# Patient Record
Sex: Female | Born: 1965 | Race: White | Hispanic: No | Marital: Married | Smoking: Never smoker
Health system: Southern US, Community
[De-identification: ages and names within clinical notes are randomized; demographics above are authoritative.]

## PROBLEM LIST (undated history)

## (undated) DIAGNOSIS — E049 Nontoxic goiter, unspecified: Secondary | ICD-10-CM

## (undated) HISTORY — DX: Nontoxic goiter, unspecified: E04.9

---
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
## 2000-01-03 ENCOUNTER — Other Ambulatory Visit: Admission: RE | Admit: 2000-01-03 | Discharge: 2000-01-03 | Payer: Self-pay | Admitting: Obstetrics and Gynecology

## 2000-05-13 ENCOUNTER — Encounter: Admission: RE | Admit: 2000-05-13 | Discharge: 2000-08-11 | Payer: Self-pay | Admitting: Obstetrics and Gynecology

## 2000-07-16 ENCOUNTER — Inpatient Hospital Stay (HOSPITAL_COMMUNITY): Admission: AD | Admit: 2000-07-16 | Discharge: 2000-07-16 | Payer: Self-pay | Admitting: Obstetrics and Gynecology

## 2000-07-18 ENCOUNTER — Inpatient Hospital Stay (HOSPITAL_COMMUNITY): Admission: AD | Admit: 2000-07-18 | Discharge: 2000-07-21 | Payer: Self-pay | Admitting: Obstetrics & Gynecology

## 2000-07-23 ENCOUNTER — Encounter: Admission: RE | Admit: 2000-07-23 | Discharge: 2000-08-22 | Payer: Self-pay | Admitting: Obstetrics and Gynecology

## 2000-08-28 ENCOUNTER — Other Ambulatory Visit: Admission: RE | Admit: 2000-08-28 | Discharge: 2000-08-28 | Payer: Self-pay | Admitting: Obstetrics and Gynecology

## 2002-04-14 ENCOUNTER — Other Ambulatory Visit: Admission: RE | Admit: 2002-04-14 | Discharge: 2002-04-14 | Payer: Self-pay | Admitting: Obstetrics and Gynecology

## 2003-06-07 ENCOUNTER — Other Ambulatory Visit: Admission: RE | Admit: 2003-06-07 | Discharge: 2003-06-07 | Payer: Self-pay | Admitting: Obstetrics and Gynecology

## 2004-04-15 DIAGNOSIS — E049 Nontoxic goiter, unspecified: Secondary | ICD-10-CM

## 2004-04-15 HISTORY — DX: Nontoxic goiter, unspecified: E04.9

## 2006-02-03 ENCOUNTER — Encounter: Admission: RE | Admit: 2006-02-03 | Discharge: 2006-02-03 | Payer: Self-pay | Admitting: Endocrinology

## 2006-05-09 ENCOUNTER — Encounter: Admission: RE | Admit: 2006-05-09 | Discharge: 2006-05-09 | Payer: Self-pay | Admitting: Endocrinology

## 2010-05-06 ENCOUNTER — Encounter: Payer: Self-pay | Admitting: Endocrinology

## 2013-01-26 ENCOUNTER — Emergency Department: Payer: Self-pay | Admitting: Emergency Medicine

## 2013-01-26 LAB — CBC
HCT: 42.3 % (ref 35.0–47.0)
MCH: 29.2 pg (ref 26.0–34.0)
MCHC: 34.2 g/dL (ref 32.0–36.0)
MCV: 85 fL (ref 80–100)
Platelet: 302 10*3/uL (ref 150–440)
RDW: 13 % (ref 11.5–14.5)

## 2013-01-26 LAB — COMPREHENSIVE METABOLIC PANEL
BUN: 11 mg/dL (ref 7–18)
Bilirubin,Total: 0.4 mg/dL (ref 0.2–1.0)
Calcium, Total: 8.7 mg/dL (ref 8.5–10.1)
Chloride: 101 mmol/L (ref 98–107)
Co2: 30 mmol/L (ref 21–32)
Creatinine: 0.76 mg/dL (ref 0.60–1.30)
EGFR (African American): >60
EGFR (Non-African Amer.): >60
Potassium: 3.8 mmol/L (ref 3.5–5.1)
SGOT(AST): 24 U/L (ref 15–37)
Sodium: 134 mmol/L — ABNORMAL LOW (ref 136–145)

## 2013-01-26 LAB — URINALYSIS, COMPLETE
Bilirubin,UR: NEGATIVE
Blood: NEGATIVE
Specific Gravity: 1.003 (ref 1.003–1.030)
WBC UR: 1 /HPF (ref 0–5)

## 2013-02-11 ENCOUNTER — Ambulatory Visit (INDEPENDENT_AMBULATORY_CARE_PROVIDER_SITE_OTHER): Payer: 59 | Admitting: General Surgery

## 2013-02-11 ENCOUNTER — Ambulatory Visit: Payer: Self-pay | Admitting: General Surgery

## 2013-02-11 ENCOUNTER — Encounter: Payer: Self-pay | Admitting: General Surgery

## 2013-02-11 VITALS — BP 130/88 | HR 74 | Resp 16 | Ht 63.0 in | Wt 229.0 lb

## 2013-02-11 DIAGNOSIS — K8020 Calculus of gallbladder without cholecystitis without obstruction: Secondary | ICD-10-CM

## 2013-02-11 NOTE — Progress Notes (Signed)
Patient ID: April Mcbride, female   DOB: Sep 09, 1965, 47 y.o.   MRN: [id number]  Chief Complaint  Patient presents with  . Other    gallbladder    HPI April Mcbride is a 47 y.o. female here today for a evaluation of her gallbladder . She had a ultrasound done at [facility] on 01-26-13. She states she has a "big ball of gas" when the pain first starts.  The pain is more of a discomfort generalized abdomen.  No triggers of foods that she can tell.  She has had 5 episodes of this attack over a 3-4 month time span. Minimal nausea but no vomiting or diarrhea or constipation. Primary physician Dr. Sherrell Puller of [place].  HPI  Past Medical History  Diagnosis Date  . Goiter 2006    radioactive iodine treatments    Past Surgical History  Procedure Laterality Date  . Appendectomy  1980    Family History  Problem Relation Age of Onset  . Heart failure Mother   . Cirrhosis Father     Social History History  Substance Use Topics  . Smoking status: Never Smoker   . Smokeless tobacco: Never Used  . Alcohol Use: Yes    No Known Allergies  Current Outpatient Prescriptions  Medication Sig Dispense Refill  . Desogestrel-Ethinyl Estradiol (VIORELE PO) Take 1 tablet by mouth daily.      . fexofenadine-pseudoephedrine (ALLEGRA-D) 60-120 MG per tablet Take 1 tablet by mouth 2 (two) times daily.      Marland Kitchen ipratropium (ATROVENT) 0.06 % nasal spray Place 2 sprays into the nose as needed for rhinitis.      Marland Kitchen levothyroxine (SYNTHROID, LEVOTHROID) 125 MCG tablet Take 125 mcg by mouth daily before breakfast.      . Loratadine (CLARITIN) 10 MG CAPS Take 1 capsule by mouth daily.       No current facility-administered medications for this visit.    Review of Systems Review of Systems  Constitutional: Negative.   Respiratory: Negative.   Cardiovascular: Negative.   Gastrointestinal: Positive for nausea. Negative for vomiting, diarrhea, constipation and blood in stool.    Blood pressure 130/88,  pulse 74, resp. rate 16, height 5\' 3"  (1.6 m), weight 229 lb (103.874 kg), last menstrual period 01/18/2013.  Physical Exam Physical Exam  Constitutional: She is oriented to person, place, and time. She appears well-developed and well-nourished.  Neck: Neck supple.  Cardiovascular: Normal rate, regular rhythm and normal heart sounds.   Pulmonary/Chest: Effort normal and breath sounds normal.  Abdominal: Soft. Normal appearance and bowel sounds are normal. There is no hepatosplenomegaly. There is no tenderness. No hernia.  Lymphadenopathy:    She has no cervical adenopathy.  Neurological: She is alert and oriented to person, place, and time.  Skin: Skin is warm and dry.    Data Reviewed Abdominal ultrasound dated 11/26/2012 was reviewed. Multiple gallstones were identified. Common bile duct: 3.8 mm. No pericholecystic fluid or gallbladder wall thickening. Laboratory studies dated 01/26/2013 showed white blood cell count 10,200, hemoglobin 14.5 per normal liver function studies. Then we depressed serum sodium at 134. Nonfasting blood sugar 120 to negative urinalysis.  Assessment    Symptomatic cholelithiasis.    Plan    Pain has not been a strong component of the patient's symptom complex, rather fullness in the epigastrium. This is most likely related to biliary colic. The possibility that these symptoms may not completely resolve post cholecystectomy was discussed with the patient. There are no symptoms to suggest a  gastric or esophageal source for her symptoms, and I do not believe endoscopy is warranted prior to proceeding to cholecystectomy.     Patient's surgery has been scheduled for 03-04-13 at [facility].   Earline Mayotte 02/13/2013, 11:51 AM

## 2013-02-11 NOTE — Patient Instructions (Addendum)
The patient is aware to call back for any questions or concerns.  Laparoscopic Cholecystectomy Laparoscopic cholecystectomy is surgery to remove the gallbladder. The gallbladder is located slightly to the right of center in the abdomen, behind the liver. It is a concentrating and storage sac for the bile produced in the liver. Bile aids in the digestion and absorption of fats. Gallbladder disease (cholecystitis) is an inflammation of your gallbladder. This condition is usually caused by a buildup of gallstones (cholelithiasis) in your gallbladder. Gallstones can block the flow of bile, resulting in inflammation and pain. In severe cases, emergency surgery may be required. When emergency surgery is not required, you will have time to prepare for the procedure. Laparoscopic surgery is an alternative to open surgery. Laparoscopic surgery usually has a shorter recovery time. Your common bile duct may also need to be examined and explored. Your caregiver will discuss this with you if he or she feels this should be done. If stones are found in the common bile duct, they may be removed. LET YOUR CAREGIVER KNOW ABOUT:  Allergies to food or medicine.  Medicines taken, including vitamins, herbs, eyedrops, over-the-counter medicines, and creams.  Use of steroids (by mouth or creams).  Previous problems with anesthetics or numbing medicines.  History of bleeding problems or blood clots.  Previous surgery.  Other health problems, including diabetes and kidney problems.  Possibility of pregnancy, if this applies. RISKS AND COMPLICATIONS All surgery is associated with risks. Some problems that may occur following this procedure include:  Infection.  Damage to the common bile duct, nerves, arteries, veins, or other internal organs such as the stomach or intestines.  Bleeding.  A stone may remain in the common bile duct. BEFORE THE PROCEDURE  Do not take aspirin for 3 days prior to surgery or  blood thinners for 1 week prior to surgery.  Do not eat or drink anything after midnight the night before surgery.  Let your caregiver know if you develop a cold or other infectious problem prior to surgery.  You should be present 60 minutes before the procedure or as directed. PROCEDURE  You will be given medicine that makes you sleep (general anesthetic). When you are asleep, your surgeon will make several small cuts (incisions) in your abdomen. One of these incisions is used to insert a small, lighted scope (laparoscope) into the abdomen. The laparoscope helps the surgeon see into your abdomen. Carbon dioxide gas will be pumped into your abdomen. The gas allows more room for the surgeon to perform your surgery. Other operating instruments are inserted through the other incisions. Laparoscopic procedures may not be appropriate when:  There is major scarring from previous surgery.  The gallbladder is extremely inflamed.  There are bleeding disorders or unexpected cirrhosis of the liver.  A pregnancy is near term.  Other conditions make the laparoscopic procedure impossible. If your surgeon feels it is not safe to continue with a laparoscopic procedure, he or she will perform an open abdominal procedure. In this case, the surgeon will make an incision to open the abdomen. This gives the surgeon a larger view and field to work within. This may allow the surgeon to perform procedures that sometimes cannot be performed with a laparoscope alone. Open surgery has a longer recovery time. AFTER THE PROCEDURE  You will be taken to the recovery area where a nurse will watch and check your progress.  You may be allowed to go home the same day.  Do not resume physical activities  until directed by your caregiver.  You may resume a normal diet and activities as directed. Document Released: 04/01/2005 Document Revised: 06/24/2011 Document Reviewed: 09/14/2010 [facility] Patient Information 2014  [place], [place].  Patient's surgery has been scheduled for 03-04-13 at [facility].

## 2013-02-13 ENCOUNTER — Encounter: Payer: Self-pay | Admitting: General Surgery

## 2013-02-13 ENCOUNTER — Other Ambulatory Visit: Payer: Self-pay | Admitting: General Surgery

## 2013-02-13 DIAGNOSIS — K8020 Calculus of gallbladder without cholecystitis without obstruction: Secondary | ICD-10-CM

## 2013-03-03 ENCOUNTER — Telehealth: Payer: Self-pay | Admitting: *Deleted

## 2013-03-03 NOTE — Telephone Encounter (Signed)
Heather in pre-admit called to say that patient had cancelled her surgery for tomorrow, 03-04-13, due to a lot of different issues she has going on at this time.   Patient states she called the answering service on Monday to make [place] aware of cancellation and we did not get the message. This patient states she would like to possibly reschedule surgery for January 2015. She will be giving the office a call back letting us know what she would like to do.   Heather in Pre-admit called the O.R. to cancel surgery. Darl Pikes in Same Day Surgery is also aware.  Will patient require a pre-op visit if patient reschedules surgery for January? Thanks.

## 2013-03-18 ENCOUNTER — Telehealth: Payer: Self-pay | Admitting: *Deleted

## 2013-03-18 NOTE — Telephone Encounter (Signed)
Patient called wanting to reschedule surgery to 04-23-13 at [facility]. Leah in O.R. notified of this.  Patient instructed to call the office if she has further questions. She reports she still has day of surgery instructions.

## 2013-03-22 ENCOUNTER — Telehealth: Payer: Self-pay | Admitting: *Deleted

## 2013-03-22 NOTE — Telephone Encounter (Signed)
Patient aware she will need a brief pre-op visit prior to surgery scheduled for 04-23-13 at [facility]. This patient will take a look at her work schedule once she returns and call the office to make appointment. She was given 04-14-13 to look and see if this date would be convenient.

## 2013-03-31 ENCOUNTER — Ambulatory Visit (INDEPENDENT_AMBULATORY_CARE_PROVIDER_SITE_OTHER): Payer: 59 | Admitting: General Surgery

## 2013-03-31 ENCOUNTER — Encounter: Payer: Self-pay | Admitting: General Surgery

## 2013-03-31 VITALS — BP 170/76 | HR 80 | Resp 16 | Ht 63.0 in | Wt 235.0 lb

## 2013-03-31 DIAGNOSIS — K8020 Calculus of gallbladder without cholecystitis without obstruction: Secondary | ICD-10-CM

## 2013-03-31 NOTE — Progress Notes (Signed)
Patient ID: April Mcbride, female   DOB: [birth year], 47 y.o.   MRN: [id number]  Chief Complaint  Patient presents with  . Pre-op Exam    Lap Chole    HPI April Mcbride is a 47 y.o. female here today for her pre op Lap Chole. She had a ultrasound done at [facility] on 01-26-13. She states she has a "big ball of gas" when the pain first starts. The pain is more of a discomfort generalized abdomen. No triggers of foods that she can tell. She has had 5 episodes of this attack over a 3-4 month time span. Minimal nausea but no vomiting or diarrhea or constipation. Her surgery scheduled for 04-23-13 at [facility].  The patient has had only one episode of discomfort since her last visit.  She denies diarrhea during these episodes, but does report a transient sense of the need to move her bowels.   HPI  Past Medical History  Diagnosis Date  . Goiter 2006    radioactive iodine treatments    Past Surgical History  Procedure Laterality Date  . Appendectomy  1980    Family History  Problem Relation Age of Onset  . Heart failure Mother   . Cirrhosis Father     Social History History  Substance Use Topics  . Smoking status: Never Smoker   . Smokeless tobacco: Never Used  . Alcohol Use: Yes    No Known Allergies  Current Outpatient Prescriptions  Medication Sig Dispense Refill  . Desogestrel-Ethinyl Estradiol (VIORELE PO) Take 1 tablet by mouth daily.      . fexofenadine-pseudoephedrine (ALLEGRA-D) 60-120 MG per tablet Take 1 tablet by mouth 2 (two) times daily.      Marland Kitchen ipratropium (ATROVENT) 0.06 % nasal spray Place 2 sprays into the nose as needed for rhinitis.      Marland Kitchen levothyroxine (SYNTHROID, LEVOTHROID) 125 MCG tablet Take 125 mcg by mouth daily before breakfast.      . Loratadine (CLARITIN) 10 MG CAPS Take 1 capsule by mouth daily.       No current facility-administered medications for this visit.    Review of Systems Review of Systems  Constitutional: Negative.   Respiratory:  Negative.   Cardiovascular: Negative.   Gastrointestinal: Negative.     Blood pressure 170/76, pulse 80, resp. rate 16, height 5\' 3"  (1.6 m), weight 235 lb (106.595 kg), last menstrual period 03/31/2013.  Physical Exam Physical Exam  Constitutional: She is oriented to person, place, and time. She appears well-developed and well-nourished.  Eyes: No scleral icterus.  Cardiovascular: Normal rate, regular rhythm and normal heart sounds.   Pulmonary/Chest: Breath sounds normal.  Abdominal: Soft. Normal appearance and bowel sounds are normal. There is no hepatosplenomegaly. There is no tenderness. No hernia.  Lymphadenopathy:    She has no cervical adenopathy.  Neurological: She is alert and oriented to person, place, and time.  Skin: Skin is warm and dry.    Data Reviewed Abdominal ultrasound dated 01/26/2013 showed multiple gallstones. No evidence of acute cholecystitis. Common bile duct measured 3.8 mm. Laboratory studies dated 01/26/2013 showed a normal hemogram, normal liver function studies, total bilirubin of 0.4. Nonfasting blood sugar 120. Assessment    Chronic cholecystitis and cholelithiasis.    Plan    The indications for surgical intervention were reviewed. The possibility of an open procedure was discussed. She is previously been provided with an Marine scientist.    Patient's surgery has been scheduled for 04-23-13 at [facility].   Donnalee Curry  W 03/31/2013, 9:11 AM

## 2013-03-31 NOTE — Addendum Note (Signed)
Addended by: Earline Mayotte on: 03/31/2013 09:18 AM   Modules accepted: Orders

## 2013-03-31 NOTE — Patient Instructions (Addendum)

## 2013-04-23 ENCOUNTER — Ambulatory Visit: Payer: Self-pay | Admitting: General Surgery

## 2013-04-23 ENCOUNTER — Encounter: Payer: Self-pay | Admitting: General Surgery

## 2013-04-23 DIAGNOSIS — K8010 Calculus of gallbladder with chronic cholecystitis without obstruction: Secondary | ICD-10-CM

## 2013-04-26 ENCOUNTER — Encounter: Payer: Self-pay | Admitting: General Surgery

## 2013-04-26 LAB — PATHOLOGY REPORT

## 2013-04-28 ENCOUNTER — Encounter: Payer: Self-pay | Admitting: General Surgery

## 2013-05-12 ENCOUNTER — Ambulatory Visit: Payer: 59 | Admitting: General Surgery

## 2013-05-27 ENCOUNTER — Encounter: Payer: Self-pay | Admitting: *Deleted

## 2014-02-14 ENCOUNTER — Encounter: Payer: Self-pay | Admitting: General Surgery

## 2014-08-06 NOTE — Op Note (Signed)
PATIENT NAME:  April Mcbride, April Mcbride MR#:  [id number] DATE OF BIRTH:  08-Oct-1965  DATE OF PROCEDURE:  04/23/2013  PREOPERATIVE DIAGNOSIS: Chronic cholecystitis and cholelithiasis.   POSTOPERATIVE DIAGNOSIS: Chronic cholecystitis and cholelithiasis.   OPERATIVE PROCEDURE: Laparoscopic cholecystectomy with intraoperative cholangiograms.   SURGEON: Donnalee CurryJeffrey Naima Veldhuizen, MD.   ANESTHESIA: General endotracheal under Dr. Dimple Caseyice.   ESTIMATED BLOOD LOSS: Minimal.   CLINICAL NOTE: This 49 year old woman has had episodic abdominal pain and ultrasound showed evidence of cholelithiasis. She was felt to be a candidate for elective cholecystectomy.   OPERATIVE NOTE: With the patient under adequate general endotracheal anesthesia, the abdomen was prepped with ChloraPrep and draped. A Veress needle was placed through a transumbilical incision. After assuring intra-abdominal location with the hanging drop test, the abdomen was insufflated with CO2 at initially 10 mmHg and later increased to 15 mmHg pressure. A 10 mm step port was expanded and inspection showed no evidence of injury from initial port placement. The patient was placed in reverse Trendelenburg position and rolled to the left. An 11 mm Xcel port was placed in the epigastric site and two 5 mm step ports placed laterally. There was noted to be extensive adhesions in the right lower quadrant from her previous open appendectomy. The gallbladder was a dusky tan color suggestive of chronic cholecystitis. The gallbladder was placed on cephalad traction and the neck of the gallbladder cleared. Fluoroscopic cholangiograms were completed using 20 mL of 1/2 strength Conray making use of a Kumar clamp. This showed initial filling of the proximal biliary tree and then later filling of the distal biliary tree. No evidence of retained stones. The cystic duct and cystic artery were doubly clipped and divided. The gallbladder was then removed from the liver bed making use of hook  cautery dissection. It was placed in an Endo Catch bag and delivered through the umbilical port site. The site was expanded to allow extraction of the 1 cm stones.   After re-establishing pneumoperitoneum, inspection from the epigastric site showed no evidence of injury from initial port placement. The right upper quadrant was irrigated with lactated Ringer's solution and good hemostasis appreciated. An 0 Maxon suture was placed through the umbilical fascia to close this defect under laparoscopic guidance. Skin incisions were then closed with 4-0 Vicryl subcuticular sutures. Benzoin, Steri-Strips, Telfa, and Tegaderm dressing was then applied. The patient tolerated the procedure well and was taken to the recovery room in stable condition.   ____________________________ Earline MayotteJeffrey W. Briauna Gilmartin, MD jwb:aw D: 04/23/2013 11:35:16 ET T: 04/23/2013 11:47:03 ET JOB#: [id number]  cc: Earline MayotteJeffrey W. Shenekia Riess, MD, <Dictator> Olivia Mackieichard Taavon, MD Cherye Gaertner Brion AlimentW Siren Porrata MD ELECTRONICALLY SIGNED 04/25/2013 20:42

## 2015-02-25 IMAGING — US ABDOMEN ULTRASOUND LIMITED
1 series · 14 of 25 positions shown · non-contrast
Comparison: none

REASON FOR EXAM: RUQ pain
COMMENTS:   Body Site: GB and Fossa, CBD, Head of Pancreas

[Series 1: abdomen ultrasound limited · 0.25mm/px · 14 of 56 slices shown]
[im 1/56]
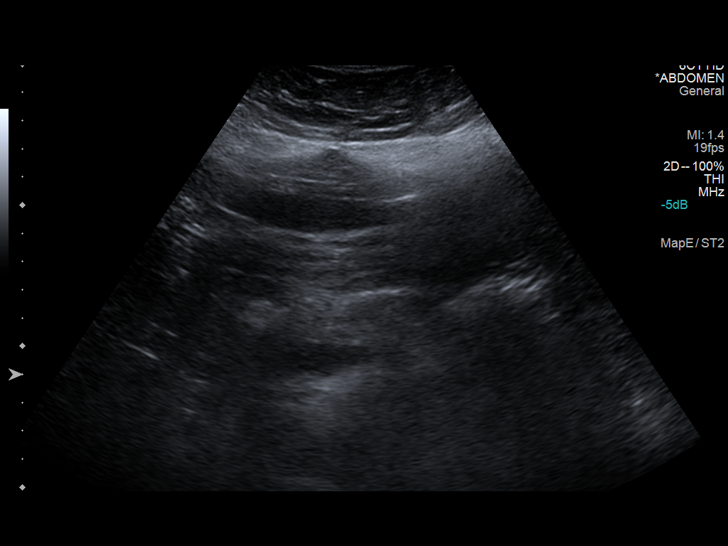
[im 5/56]
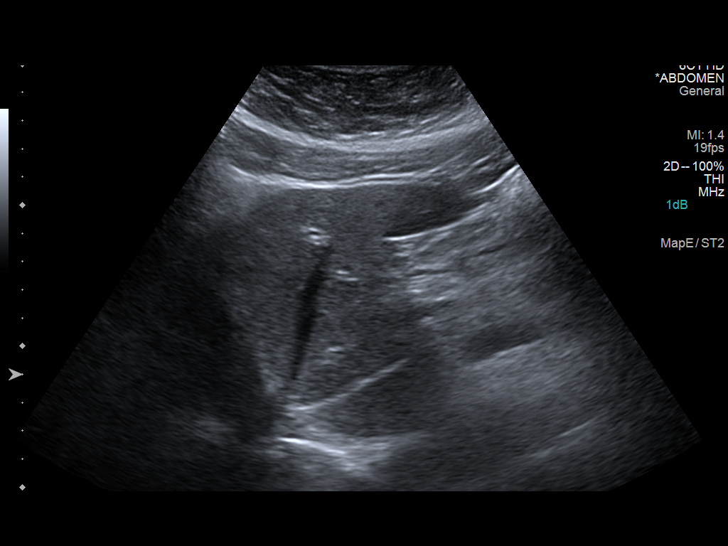
[im 10/56]
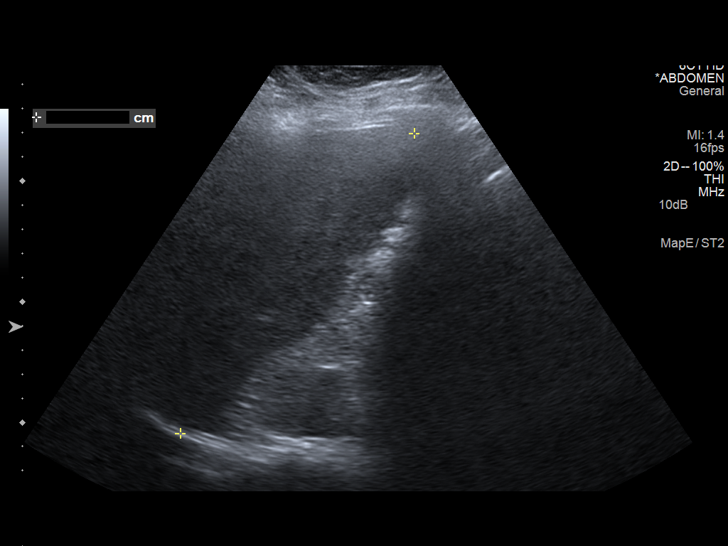
[im 14/56]
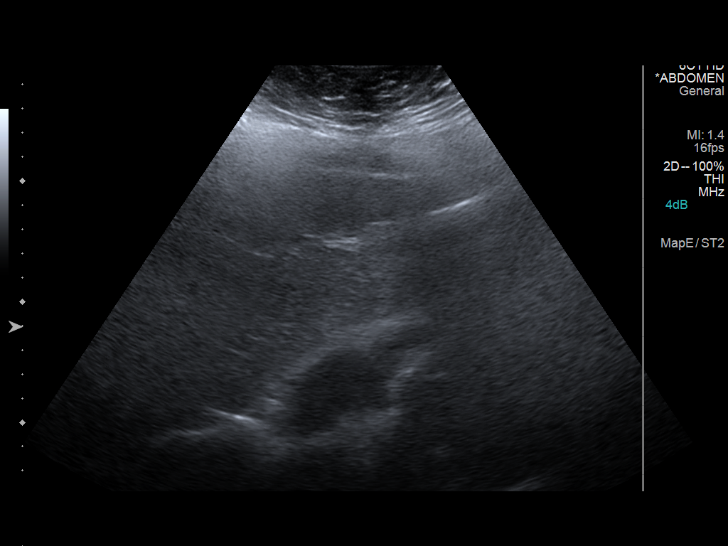
[im 19/56]
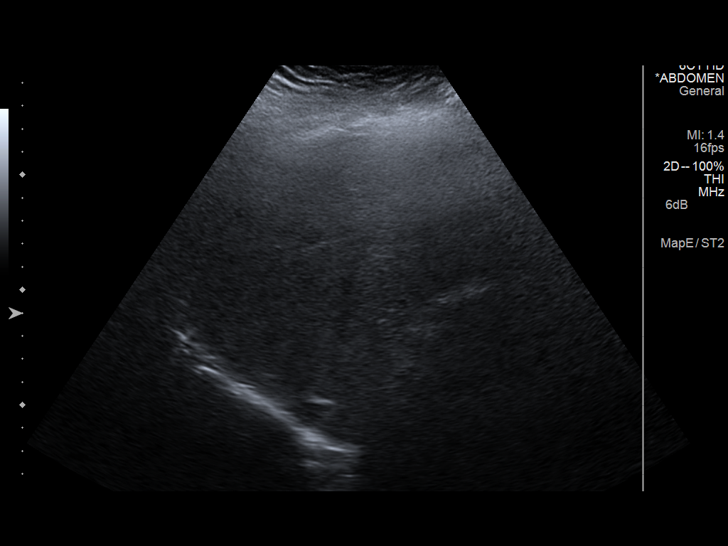
[im 21/56]
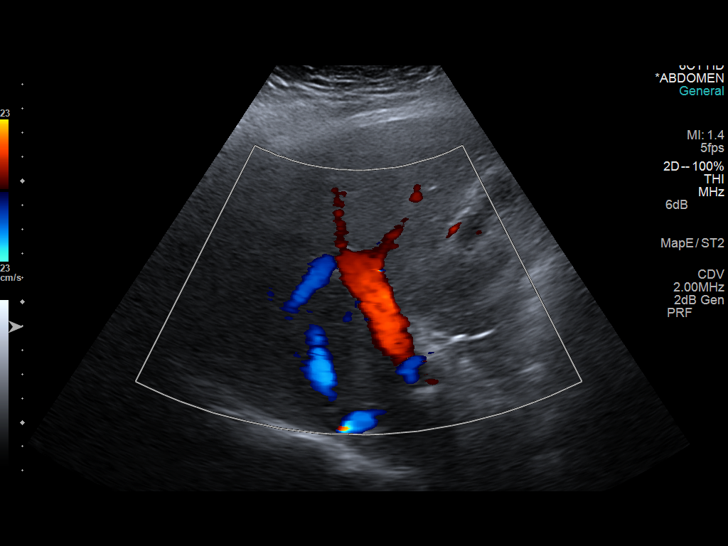
[im 26/56]
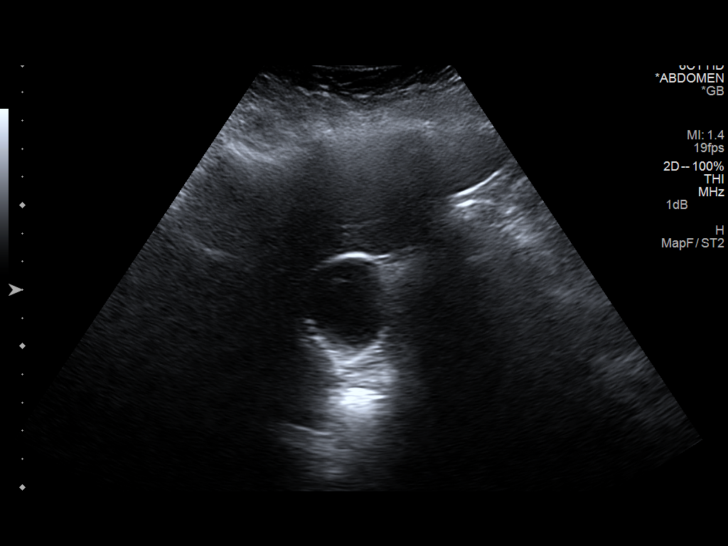
[im 30/56]
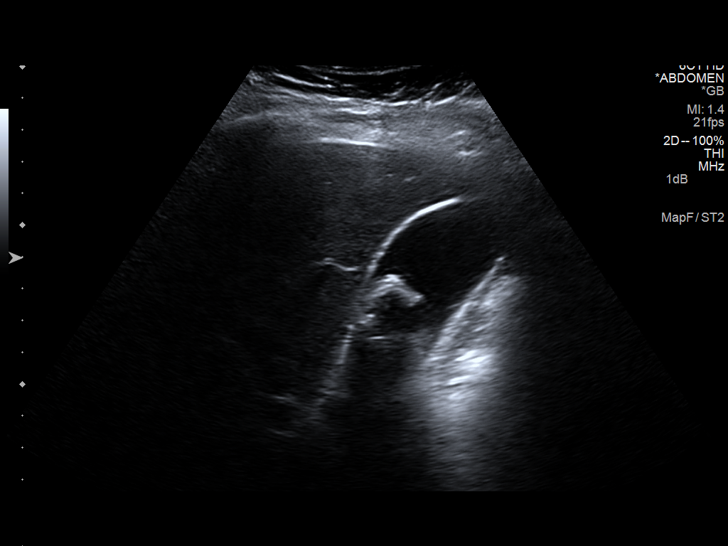
[im 35/56]
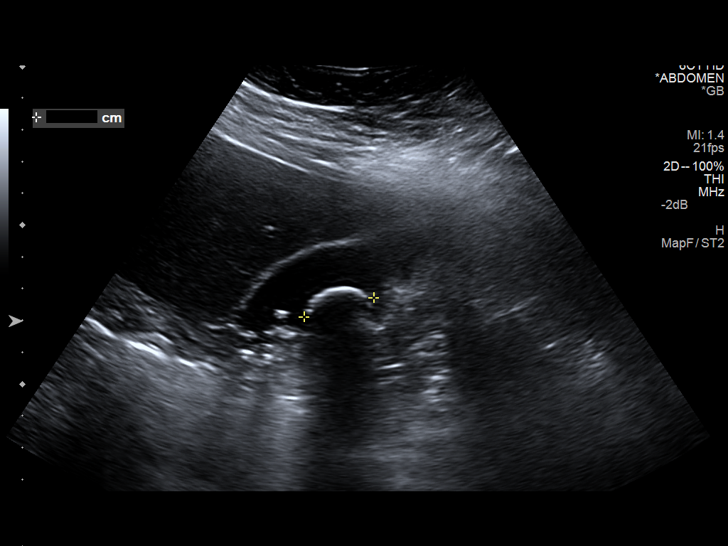
[im 37/56]
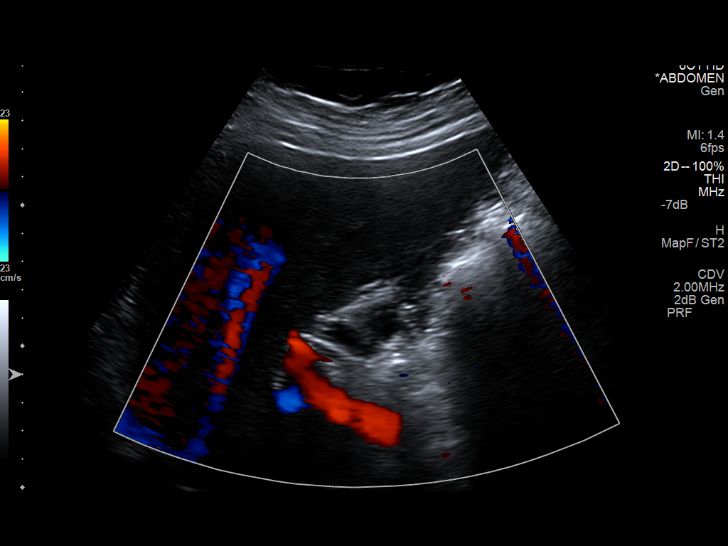
[im 42/56]
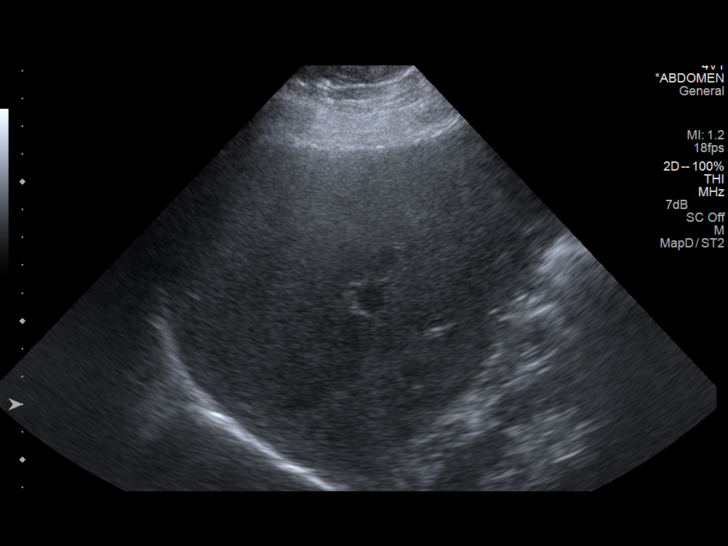
[im 46/56]
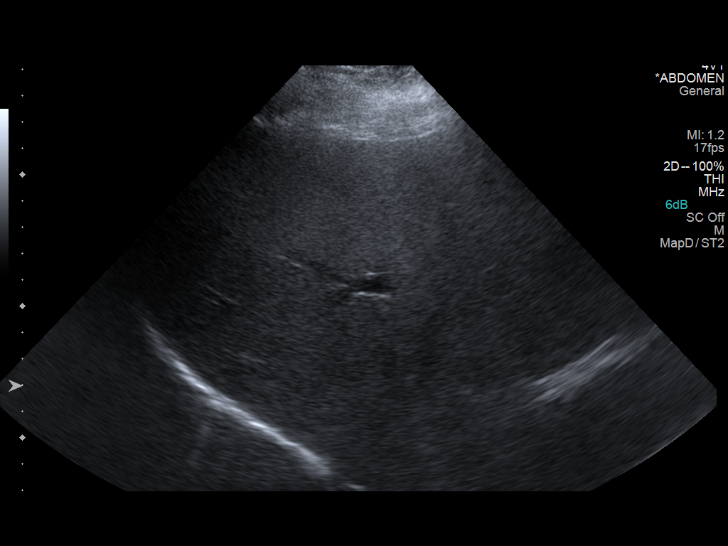
[im 51/56]
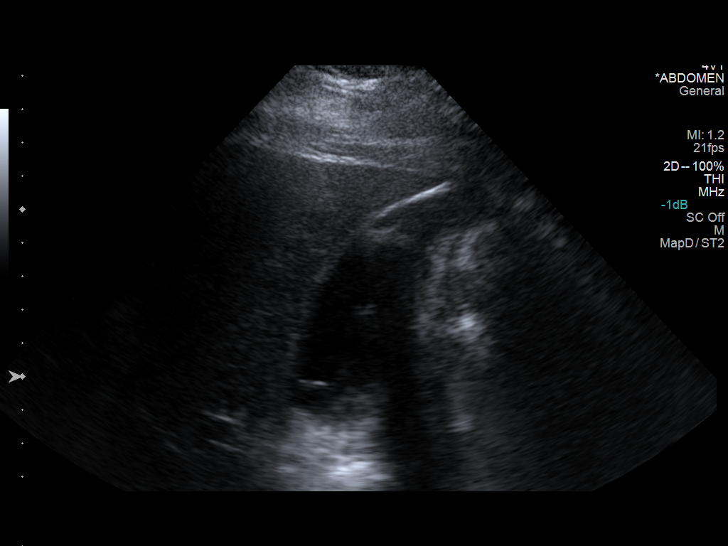
[im 56/56]
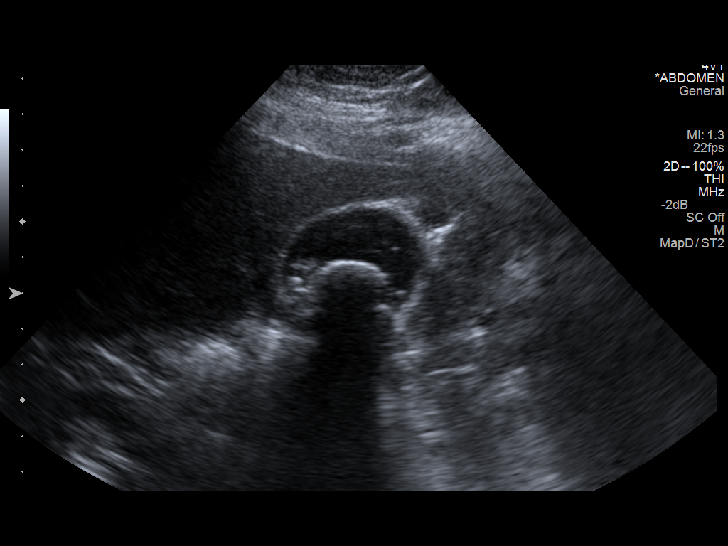

[14 of 25 positions shown; findings below may reference images not displayed]

PROCEDURE:     US  - US ABDOMEN LIMITED SURVEY  - January 26, 2013  [DATE]

RESULT:     A limited upper abdominal ultrasound examination was performed.

The liver exhibits normal echotexture with no focal mass nor ductal
dilation. Portal venous flow is normal in direction toward the liver. The
gallbladder is adequately distended and contains multiple echogenic mobile
shadowing stones. There is no positive sonographic Murphy's sign. There is
no gallbladder wall thickening or pericholecystic fluid. The common bile
duct measures 3.8 mm in diameter. The visualized portions of the pancreas
appear normal.
IMPRESSION: 1. There are multiple gallstones without sonographic evidence of acute
cholecystitis.
2. The liver, common bile duct, and observed portions of the pancreas appear
normal.

[REDACTED]
